# Patient Record
Sex: Male | Born: 1985 | Hispanic: Yes | Marital: Married | Smoking: Never smoker
Health system: Southern US, Community
[De-identification: ages and names within clinical notes are randomized; demographics above are authoritative.]

---

## 2012-01-24 ENCOUNTER — Emergency Department (HOSPITAL_COMMUNITY)
Admission: EM | Admit: 2012-01-24 | Discharge: 2012-01-24 | Disposition: A | Discharge status: Home / Self Care | Payer: Self-pay | Attending: Emergency Medicine | Admitting: Emergency Medicine

## 2012-01-24 ENCOUNTER — Encounter (HOSPITAL_COMMUNITY): Payer: Self-pay | Admitting: *Deleted

## 2012-01-24 ENCOUNTER — Emergency Department (HOSPITAL_COMMUNITY): Payer: Self-pay

## 2012-01-24 DIAGNOSIS — M25569 Pain in unspecified knee: Secondary | ICD-10-CM | POA: Insufficient documentation

## 2012-01-24 DIAGNOSIS — M7040 Prepatellar bursitis, unspecified knee: Secondary | ICD-10-CM | POA: Insufficient documentation

## 2012-01-24 MED ORDER — IBUPROFEN 600 MG PO TABS: 600.0000 mg | ORAL_TABLET | Freq: Four times a day (QID) | ORAL | Status: AC | PRN

## 2012-01-24 NOTE — ED Notes (Signed)
NAD noted at time of d/c home. D/C inst reviewed with pt and pt verbalized understanding.

## 2012-01-24 NOTE — ED Notes (Signed)
Heather, PA-C in room now. Pt c/o left medial knee pain. States it started suddenly 2 days ago, tender to touch, limited ROM. Pt states he had same pain 3 years ago with small amt of pain continuing in the knee over the last 3 years.

## 2012-01-24 NOTE — Discharge Instructions (Signed)
Bursitis  (Bursitis)  Se llama bursitis cuando el saco lleno de lquido (bursa) que cubre y protege la articulacin se hincha y se Barrister's clerk. Las articulaciones del codo, el hombro, la cadera y la rodilla son los ms afectados. CUIDADOS EN EL HOGAR   Aplique hielo sobre la zona.   Ponga el hielo en una bolsa plstica.   Colquese una toalla entre la piel y la bolsa de hielo.   Deje el hielo durante 15 a 20 minutos, 3 a 4 veces por da.   Realice con la articulacin un rango completo de movimiento 4 veces al da. En otros momentos haga reposar la articulacin lesionada. Cuando Animal nutritionist, comience a Education officer, environmental movimientos lentos y sus actividades habituales.   Slo tome los medicamentos segn le indique el mdico.   Concurra a las visitas de control con el mdico. Toda demora en los cuidados podr impedir que la bursitis se cure. Esto causa dolor a Air cabin crew.  SOLICITE AYUDA DE INMEDIATO SI:   Tiene ms dolor con tratamiento.   La temperatura oral le sube a ms de 102 F (38.9 C), y no puede bajarla con medicamentos.   Siente calor e inflamacin en la bursa involucrada.  ASEGRESE DE QUE:   Comprende estas instrucciones.   Controlar su enfermedad.   Solicitar ayuda de inmediato si no mejora o si empeora.  Document Released: 08/28/2011 [facility] Patient Information 2012 [place], [place].  RESOURCE GUIDE  Dental Problems  Patients with Medicaid: [facility] [phone] [address]                                           [phone] [address] Phone:  [phone]                                                  Phone:  [phone]  If unable to pay or uninsured, contact:  [facility] or [facility]. to become qualified for the adult dental clinic.  Chronic Pain Problems Contact [facility]  [phone] Patients need to be referred by their primary care doctor.  Insufficient Money for  Medicine Contact United Way:  call "211" or [facility] [phone].  No Primary Care Doctor Call [facility]  [phone] Other agencies that provide inexpensive medical care    [facility]  [phone]    [facility]  [phone]    [facility]  [phone]    [facility]  [phone]    [facility]  [phone]    [facility]  [phone]  Psychological Services [facility] Behavioral Health  [phone] [facility] Services  [phone] [facility] Mental Health   [phone] (emergency services [phone])  Substance Abuse Resources [facility]  [phone] [facility] [phone] [facility] [phone] Floydene Flock [phone] Residential & Outpatient Substance Abuse Program  [phone]  Abuse/Neglect [facility] Child Abuse Hotline 306-180-5428 [facility] Child Abuse Hotline [phone] (After Hours)  Emergency Shelter [facility] [phone]  Maternity Homes [facility] [phone])  [phone] [address] Services 5318657309  MRSA Hotline #:   (762) 654-8428    [facility] Resources  [facility]     United Way                          [facility] Dept. [address] [place]                       [address]      [address]  Blondell Reveal Phone:  [phone]                                   Phone:  [phone]                 Phone:  [phone]  [facility] Phone:  [phone]  [facility] Child Abuse Hotline 250-091-4917 [phone] (After Hours)

## 2012-01-24 NOTE — ED Provider Notes (Signed)
History     CSN: [id number]  Arrival date & time 01/24/12  7829   First MD Initiated Contact with Patient 01/24/12 [phone]      Chief Complaint  Patient presents with  . Knee Pain    (Consider location/radiation/quality/duration/timing/severity/associated sxs/prior treatment) HPI Comments: Patient comes in today with a chief complaint of left knee pain.  Pain started 2 days ago and is becoming progressively worse.  No injury or trauma.  He denies any swelling, erythema, or warmth to the knee.  Denies fever or chills.  He has not taken anything for the pain.  Pain increased with ambulation and movement of the knee.  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    Patient is a 26 y.o. male presenting with knee pain. The history is provided by the patient.  Knee Pain Pertinent negatives include no chills, fever, joint swelling, nausea, rash or vomiting.    History reviewed. No pertinent past medical history.  History reviewed. No pertinent past surgical history.  No family history on file.  History  Substance Use Topics  . Smoking status: Never Smoker   . Smokeless tobacco: Not on file  . Alcohol Use: Yes      Review of Systems  Constitutional: Negative for fever and chills.  Gastrointestinal: Negative for nausea and vomiting.  Musculoskeletal: Negative for joint swelling.       Pain with ambulation  Skin: Negative for color change and rash.    Allergies  Review of patient's allergies indicates no known allergies.  Home Medications   Current Outpatient Rx  Name Route Sig Dispense Refill  . IBUPROFEN 200 MG PO TABS Oral Take 400 mg by mouth every 6 (six) hours as needed. For pain      BP 125/67  Pulse 81  Temp(Src) 97.5 F (36.4 C) (Oral)  Resp 16  SpO2 99%  Physical Exam  Nursing note and vitals reviewed. Constitutional: He appears well-developed and well-nourished. No distress.  HENT:  Head:  Normocephalic and atraumatic.  Mouth/Throat: Oropharynx is clear and moist.  Neck: Normal range of motion. Neck supple.  Cardiovascular: Normal rate, regular rhythm and normal heart sounds.   Pulses:      Dorsalis pedis pulses are 2+ on the right side, and 2+ on the left side.  Pulmonary/Chest: Effort normal and breath sounds normal. No respiratory distress. He has no wheezes.  Musculoskeletal: Normal range of motion.       Left knee: He exhibits bony tenderness. He exhibits normal range of motion, no swelling, no effusion, no ecchymosis, no deformity, no erythema, no LCL laxity and no MCL laxity. tenderness found. Medial joint line tenderness noted.       Tenderness to palpation medial to the left patella  Neurological: He is alert. No sensory deficit.       Walking with limp  Skin: Skin is warm and dry. He is not diaphoretic. No erythema.  Psychiatric: He has a normal mood and affect.    ED Course  Procedures (including critical care time)  Labs Reviewed - No data to display Dg Knee Complete 4 Views Left  01/24/2012  *RADIOLOGY REPORT*  Clinical Data: Medial pain without trauma  LEFT KNEE - COMPLETE 4+ VIEW  Comparison: None.  Findings: Small effusion in the suprapatellar bursa.  Negative for fracture, dislocation, or other acute bony abnormality.  Normal mineralization and alignment.  No significant osseous degenerative change.  IMPRESSION:  1.  Small effusion without fracture or other acute bony abnormality.  Original Report Authenticated By: Osa Craver, M.D.     No diagnosis found.    MDM  Patient comes in with knee pain.  No acute injury or trauma.  No erythema, warmth, or swelling noted.  Patient afebrile.  Xray showing small effusion of prepatellar bursa.  Therefore, feel that patient's pain most likely bursitis.  Patient instructed to rest, ice, take ibuprofen, and given knee brace.          Pascal Lux Comstock, PA-C 01/25/12 2046

## 2012-01-24 NOTE — Progress Notes (Signed)
Orthopedic Tech Progress Note Patient Details:  Craig Gay January 20, 1986 [id number]  Other Ortho Devices Type of Ortho Device: Knee Sleeve Ortho Device Interventions: Application   Cammer, Mickie Bail 01/24/2012, 8:33 AM

## 2012-01-24 NOTE — ED Notes (Signed)
Ortho Tech called about knee sleeve.

## 2012-01-24 NOTE — ED Notes (Addendum)
C/o L knee pain, onset 2d ago, started out with medial L knee pain, now it is the whole knee, redness and swelling noted, h/o similar 3 yrs ago. Pain worse with bairing weight, walking, standing or turning knee. Some language barrier present, pt speaks spanish primarily.

## 2012-01-26 NOTE — ED Provider Notes (Signed)
Medical screening examination/treatment/procedure(s) were performed by non-physician practitioner and as supervising physician I was immediately available for consultation/collaboration.   Gwyneth Sprout, MD 01/26/12 2146

## 2013-12-30 IMAGING — CR DG KNEE COMPLETE 4+V*L*
4 series · 4 of 4 positions shown · non-contrast
Comparison: None.

CLINICAL DATA: Medial pain without trauma

LEFT KNEE - COMPLETE 4+ VIEW

[t knee ap left]
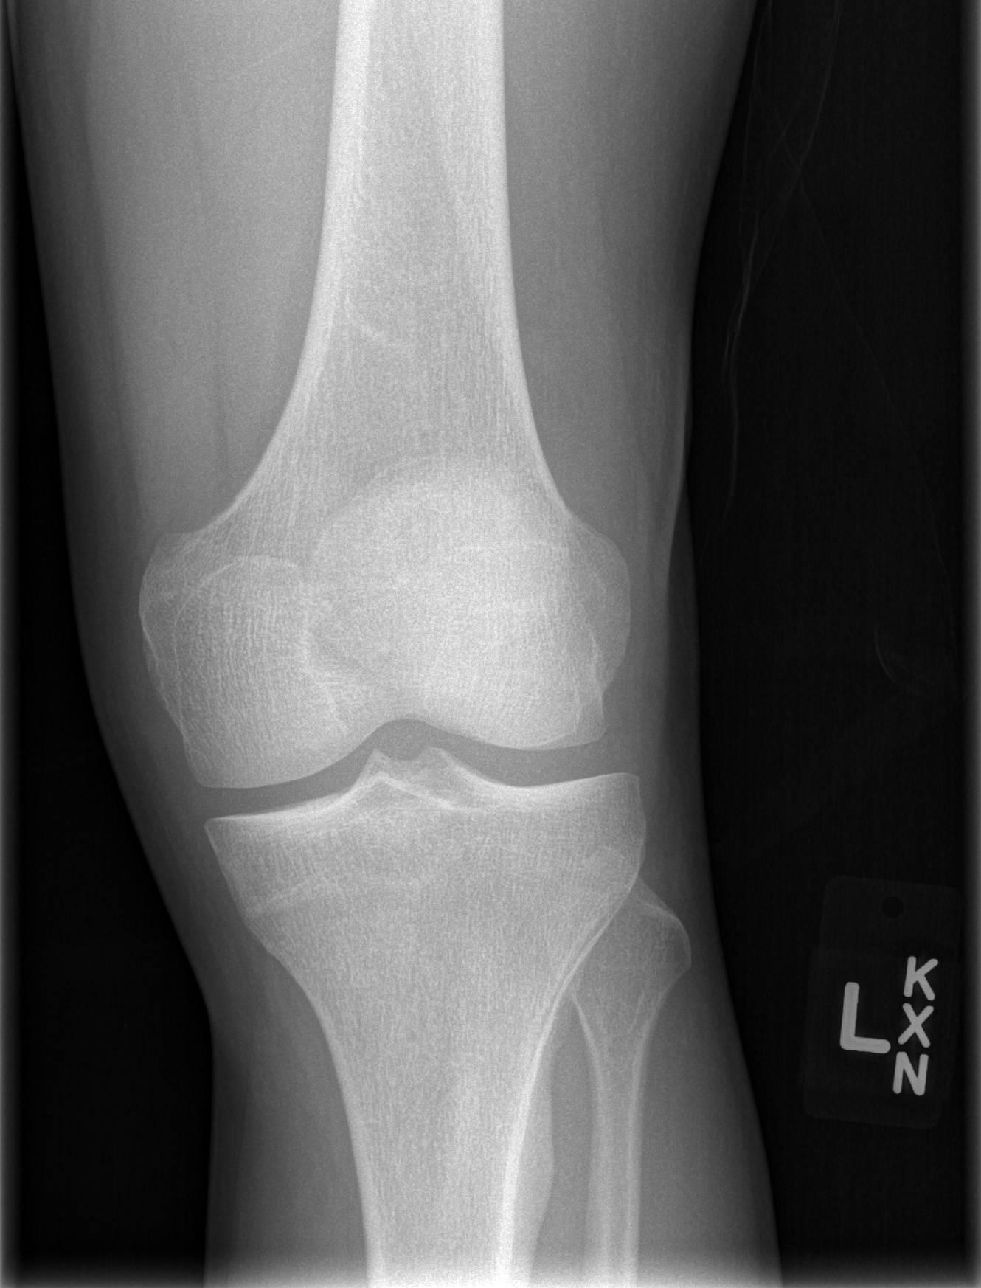

[t knee oblique left (1 of 2)]
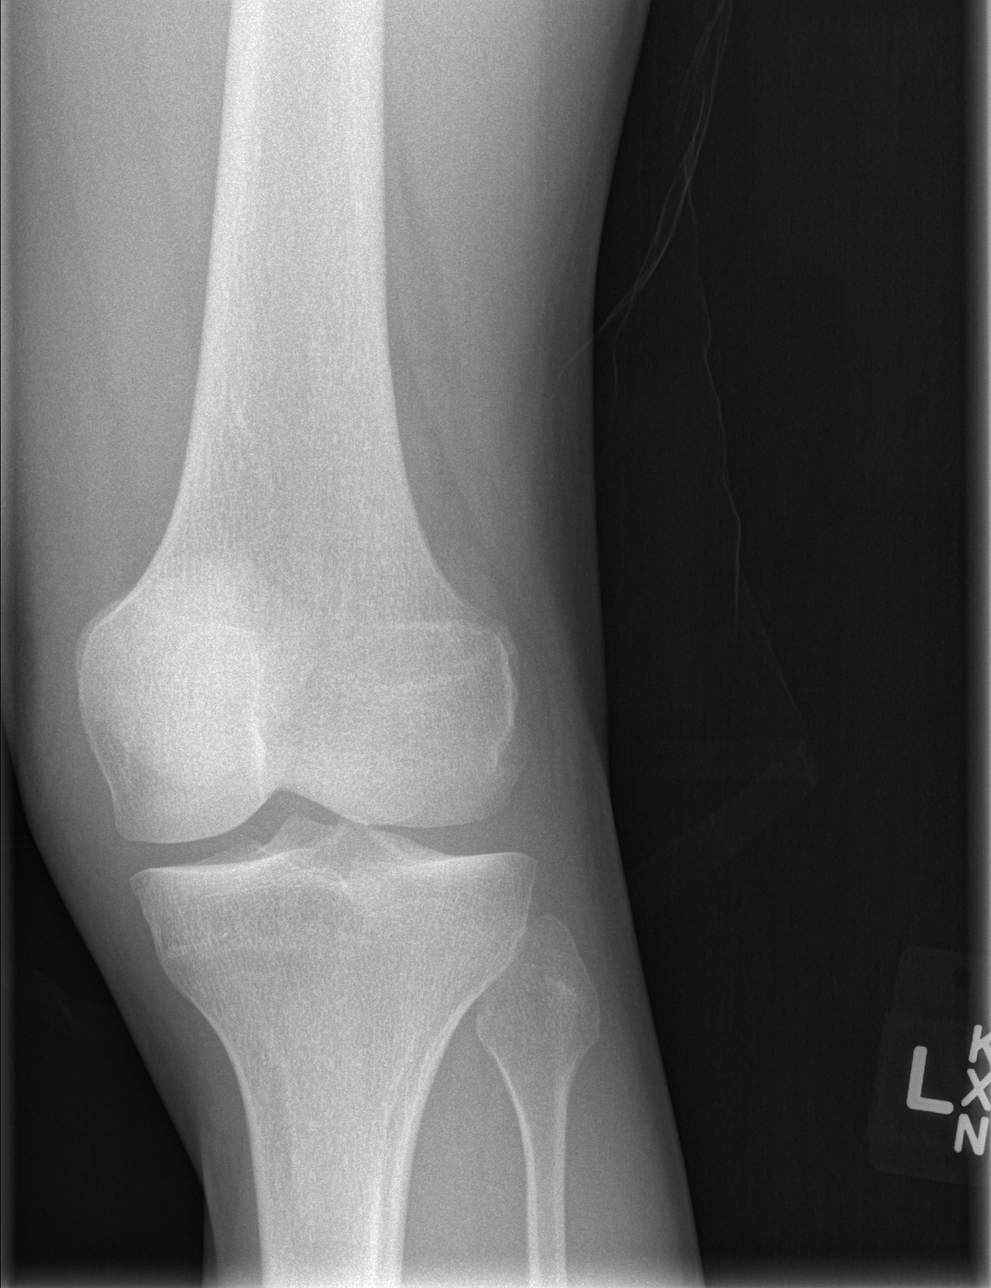

[t knee oblique left (2 of 2)]
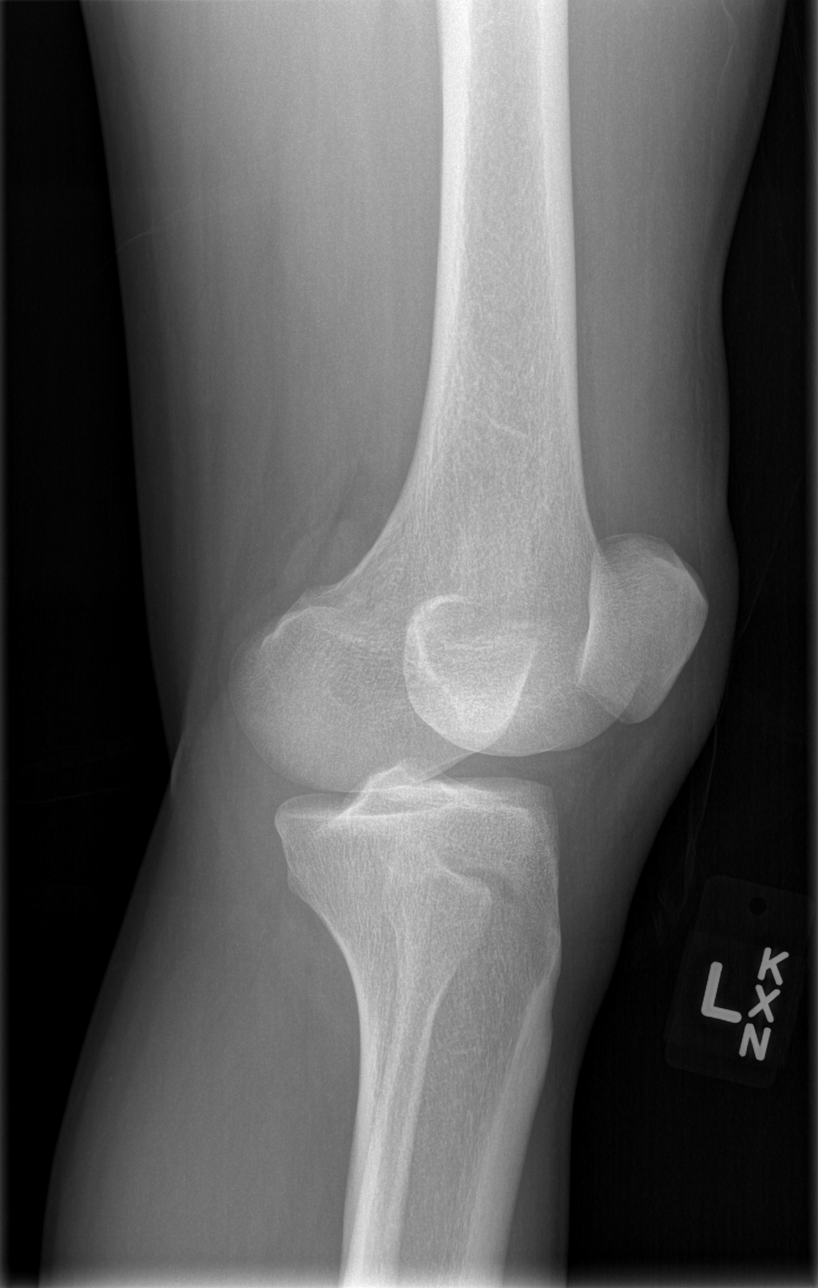

[t knee lat left]
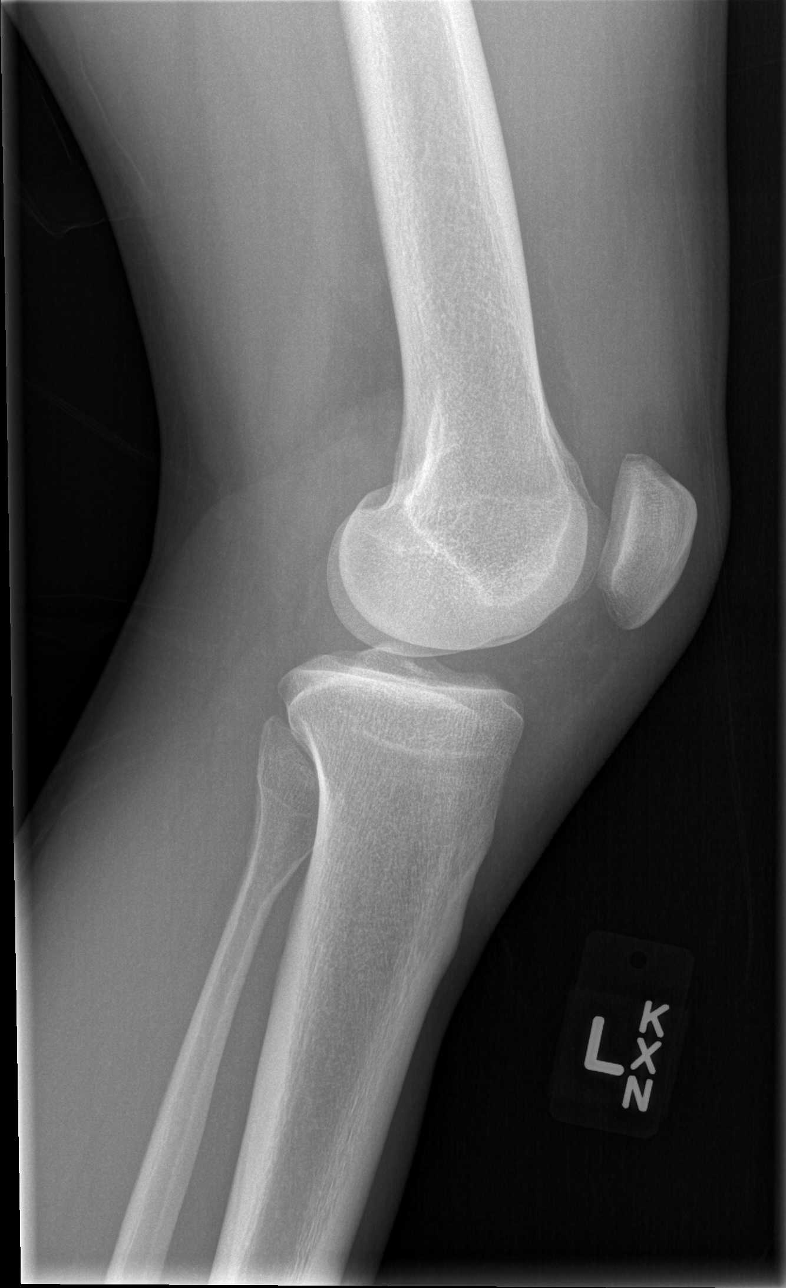

[4 of 4 positions shown; findings below may reference images not displayed]

FINDINGS: Small effusion in the suprapatellar bursa.  Negative for
fracture, dislocation, or other acute bony abnormality.  Normal
mineralization and alignment.  No significant osseous degenerative
change.
IMPRESSION: 1.  Small effusion without fracture or other acute bony
abnormality.

## 2016-03-06 ENCOUNTER — Emergency Department (HOSPITAL_COMMUNITY)
Admission: EM | Admit: 2016-03-06 | Discharge: 2016-03-06 | Disposition: A | Discharge status: Home / Self Care | Payer: Self-pay | Attending: Emergency Medicine | Admitting: Emergency Medicine

## 2016-03-06 ENCOUNTER — Encounter (HOSPITAL_COMMUNITY): Payer: Self-pay | Admitting: Neurology

## 2016-03-06 DIAGNOSIS — S0990XA Unspecified injury of head, initial encounter: Secondary | ICD-10-CM | POA: Insufficient documentation

## 2016-03-06 DIAGNOSIS — Y939 Activity, unspecified: Secondary | ICD-10-CM | POA: Insufficient documentation

## 2016-03-06 DIAGNOSIS — Y929 Unspecified place or not applicable: Secondary | ICD-10-CM | POA: Insufficient documentation

## 2016-03-06 DIAGNOSIS — W228XXA Striking against or struck by other objects, initial encounter: Secondary | ICD-10-CM | POA: Insufficient documentation

## 2016-03-06 DIAGNOSIS — Y990 Civilian activity done for income or pay: Secondary | ICD-10-CM | POA: Insufficient documentation

## 2016-03-06 NOTE — ED Notes (Signed)
Pt reports today at work was hit in the head with a piece of wood. No signs of trauma. Is a x 4. In NAD. No LOC.

## 2016-03-06 NOTE — ED Notes (Signed)
Pt states pain is worse when he opens his mouth

## 2016-03-06 NOTE — ED Provider Notes (Signed)
CSN: [id number]     Arrival date & time 03/06/16  1432 History  By signing my name below, I, Essence Howell, attest that this documentation has been prepared under the direction and in the presence of Santiago Glad, PA-C Electronically Signed: Charline Bills, ED Scribe 03/06/2016 at 3:50 PM.   Chief Complaint  Patient presents with  . Head Injury   The history is provided by the patient. No language interpreter was used.   HPI Comments: Craig Gay is a 30 y.o. male who presents to the Emergency Department complaining of a head injury sustained around 2 PM today. Pt states that he was putting a 3 ft x 3 ft piece of plywood away in the trash when the plywood flung backwards and struck him on the left side of his head. He reports feeling unsteady on his feet for a few seconds following the incident but denies LOC.  He has an associated headache of the area where he was hit.  He also denies dizziness, nausea, vomiting, visual disturbances. No anticoagulant or antiplatelet use.   History reviewed. No pertinent past medical history. History reviewed. No pertinent past surgical history. No family history on file. Social History  Substance Use Topics  . Smoking status: Never Smoker   . Smokeless tobacco: None  . Alcohol Use: Yes    Review of Systems  Eyes: Negative for visual disturbance.  Gastrointestinal: Negative for vomiting.  Neurological: Negative for dizziness.  All other systems reviewed and are negative.  Allergies  Review of patient's allergies indicates no known allergies.  Home Medications   Prior to Admission medications   Medication Sig Start Date End Date Taking? Authorizing Provider  ibuprofen (ADVIL,MOTRIN) 200 MG tablet Take 400 mg by mouth every 6 (six) hours as needed. For pain    Historical Provider, MD   BP 140/91 mmHg  Pulse 96  Temp(Src) 99.8 F (37.7 C) (Oral)  Resp 16  SpO2 100% Physical Exam  Constitutional: He is oriented to person, place, and time.  He appears well-developed and well-nourished. No distress.  HENT:  Head: Normocephalic and atraumatic. Head is without laceration.  No scalp hematoma or laceration  Eyes: Conjunctivae and EOM are normal. Pupils are equal, round, and reactive to light.  Neck: Normal range of motion. Neck supple. No tracheal deviation present.  Cardiovascular: Normal rate, regular rhythm and normal heart sounds.   Pulmonary/Chest: Effort normal and breath sounds normal. No respiratory distress.  Musculoskeletal: Normal range of motion.  Muscle strength is normal  Neurological: He is alert and oriented to person, place, and time. He has normal strength. No cranial nerve deficit or sensory deficit. Coordination and gait normal.  Cranial nerves intact Normal gait No ataxia  Skin: Skin is warm and dry.  Psychiatric: He has a normal mood and affect. His behavior is normal.  Nursing note and vitals reviewed.  ED Course  Procedures (including critical care time) DIAGNOSTIC STUDIES: Oxygen Saturation is 100% on RA, normal by my interpretation.    COORDINATION OF CARE: 3:37 PM-Discussed treatment plan which includes tylenol or ibuprofen with pt at bedside and pt agreed to plan.    MDM   Final diagnoses:  None  Patient presents today after being hit by a piece of plywood that flung back from the trash can when he was throwing away some trash.  No LOC.  Head is atraumatic on exam.  Normal neurological exam.  Therefore, do not feel that imaging is indicated.  Patient stable for discharge.  Return precautions given.  I personally performed the services described in this documentation, which was scribed in my presence. The recorded information has been reviewed and is accurate.    Santiago GladHeather Aditri Louischarles, PA-C 03/06/16 1621  Lavera Guiseana Duo Liu, MD 03/07/16 [phone]

## 2016-03-06 NOTE — ED Notes (Signed)
Pt stable, ambulatory, states understanding of discharge instructions 

## 2016-03-06 NOTE — Discharge Instructions (Signed)
Take Ibuprofen or Tylenol for headache.   Conmocin cerebral - Adultos (Concussion, Adult) Una conmocin es una lesin cerebral. Las causas pueden ser:  Un golpe en la cabeza.  Un movimiento rpido y brusco (sacudida) de la cabeza o el cuello. Generalmente no pone en peligro la vida. Sin embargo, puede causar problemas graves. Si sufri una conmocin cerebral antes, puede tener sntomas similares a la conmocin despus de un golpe en la cabeza. CUIDADOS EN EL HOGAR Instrucciones generales  Siga cuidadosamente las indicaciones del mdico.  Tome los medicamentos como le indic el mdico.  Solo tome los medicamentos que su mdico considere seguros.  No beba alcohol hasta que el mdico lo autorice. El alcohol y algunos medicamentos pueden hacer ms lenta la curacin. Tambin pueden ponerlo en riesgo de sufrir otras lesiones.  Si tiene dificultad para recordar las cosas, escrbalas.  Trate de hacer una cosa por vez si se distrae con facilidad. Por ejemplo, no mire televisin mientras prepara la cena.  Consulte con familiares y amigos si debe tomar decisiones importantes.  Concurra a las consultas de control con el mdico, segn las indicaciones.  Observe sus sntomas. Dgale a los dems que hagan lo mismo. En algunos casos, despus de una conmocin cerebral surgen problemas graves. Es ms probable que Limited Brandsestos problemas graves los sufran los adultos Taylormayores.  Informe a los [address]estros, el departamento de enfermera de la escuela, el consejero escolar, Public affairs consultantel entrenador o director acerca de su conmocin cerebral. Hgales saber lo que puede y lo que no Scientist, product/process developmentpuede hacer. Ellos debern observarlo para ver si:  Tiene ms dificultad para prestar atencin o concentrarse.  Le cuesta an ms recordar las cosas o aprender cosas nuevas.  Necesita ms tiempo que lo habitual para finalizar las tareas.  Est ms molesto (irritable) que antes.  Tampoco puede Charity fundraisercontrolar el estrs.  Tiene ms problemas que  antes.  Haga reposo. Asegrese de que:  Duerme bien por la noche.  Se va a dormir temprano.  Acustese CarMaxtodos los das a la misma hora aproximadamente. Trate de despertarse a la misma hora.  Descanse Administratordurante el da.  Tome una siesta cuando se sienta cansado.  Limite las actividades que requieran mucha concentracin. Estas pueden ser:  Hacer la tarea para Advice workerel hogar.  Hacer tareas relacionadas con un trabajo.  Mirar televisin.  Usar la computadora. Regreso a las State Street Corporationactividades habituales Regrese a las actividades habituales gradualmente, no [place]quiera hacer todo de Building control surveyoruna vez. Debe darles al cuerpo y el cerebro su tiempo para recuperarse.   No practique deportes ni realice otras actividades deportivas hasta que el mdico lo autorice.  Consulte a su mdico cundo puede andar en bicicleta o conducir otros vehculos o mquinas. Nunca haga estas cosas si se siente mareado.  Pregunte a su mdico cundo podr volver a la escuela o al Aleen Campitrabajo. Prevencin de otra conmocin cerebral Es muy importante que evite otra lesin cerebral, especialmente antes de que se haya recuperado. En casos raros, una nueva lesin puede causar daos cerebrales permanentes, hinchazn del cerebro o la muerte. El riesgo es mayor durante los primeros 7 a 10das despus de una lesin. Evite las lesiones:   Use el cinturn de seguridad al conducir un automvil.  Evite beber alcohol en exceso.  Evite las actividades que puedan favorecer una segunda conmocin cerebral (como al practicar deportes de contacto).  Use un casco cuando practique actividades como:  Andar en bicicleta.  Esquiar.  Andar en patineta.  Andar en patines.  Haga de su hogar un  lugar ms seguro:  Retire las cosas del piso o de las escaleras que podran hacerlo tropezar.  Coloque barras en los baos y pasamanos en las escaleras.  Ponga alfombras antideslizantes en pisos y baeras.  Mejore la iluminacin en zonas de penumbra. SOLICITE AYUDA  SI:  Tiene ms dificultad para prestar atencin o concentrarse.  Le cuesta an ms recordar las cosas o aprender cosas nuevas.  Necesita ms tiempo que lo habitual para finalizar las tareas.  Est ms molesto (irritable) que antes.  Tampoco puede Charity fundraiser.  Tiene ms problemas que antes.  Tiene problemas para mantener el equilibrio.  No logra reaccionar rpidamente cuando lo necesita. Solicite ayuda si tiene alguno de estos problemas durante ms de 2 semanas:   Dolor de cabeza que perdura (crnico).  Mareos o problemas de equilibrio.  Ganas de vomitar (nuseas).  Problemas para ver (de vista).  Sentirse afectado por ruidos o la luz ms que lo normal.  Sentir tristeza, decaimiento, sufrimiento espiritual, melancola, pesimismo o vaco (depresin).  Cambios de humor (cambios en el estado de nimo).  Sensacin de miedo o nerviosismo por lo que podra ocurrir (ansiedad).  Se siente abrumado.  Problemas de memoria.  Dificultad para concentrarse o Engineer, technical sales.  Problemas para dormir.  Cansancio permanente. SOLICITE AYUDA DE INMEDIATO SI:   Tiene dolores de cabeza intensos o estos empeoran.  Siente debilidad (aun si es solo en [place], una pierna o parte del rostro).  Tiene falta de sensibilidad (adormecimiento).  Siente que pierde el equilibrio.  No deja de vomitar.  Siente cansancio.  Uno de los centros negros del ojo (pupila) es ms grande que el otro.  Se retuerce o se sacude violentamente (tiene convulsiones).  El habla no es clara (arrastra las palabras).  Se siente ms confundido, se enoja con ms facilidad (agitacin) o est ms irritable que antes.  Tiene ms dificultad para descansar que antes.  No puede Nutritional therapist o lugares.  Siente dolor en el cuello.  Le resulta difcil despertarse.  Tiene cambios de conducta inusuales.  Se desmaya (pierde el conocimiento). ASEGRESE DE QUE:   Comprende estas  instrucciones.  Controlar su afeccin.  Recibir ayuda de inmediato si no mejora o si empeora.   Esta informacin no tiene Theme park manager el consejo del mdico. Asegrese de hacerle al mdico cualquier pregunta que tenga.   Document Released: 10/11/2010 Document Revised: 09/29/2014 Elsevier Interactive Patient Education [employer]
